# Patient Record
Sex: Male | Born: 1970 | Hispanic: No | Marital: Married | Smoking: Never smoker
Health system: Southern US, Community
[De-identification: ages and names within clinical notes are randomized; demographics above are authoritative.]

## PROBLEM LIST (undated history)

## (undated) DIAGNOSIS — I83813 Varicose veins of bilateral lower extremities with pain: Secondary | ICD-10-CM

## (undated) DIAGNOSIS — I10 Essential (primary) hypertension: Secondary | ICD-10-CM

## (undated) HISTORY — DX: Essential (primary) hypertension: I10

## (undated) HISTORY — DX: Varicose veins of bilateral lower extremities with pain: I83.813

---

## 2011-05-26 ENCOUNTER — Emergency Department: Payer: Self-pay | Admitting: *Deleted

## 2011-05-26 LAB — BASIC METABOLIC PANEL
Anion Gap: 7 (ref 7–16)
BUN: 15 mg/dL (ref 7–18)
Calcium, Total: 8.8 mg/dL (ref 8.5–10.1)
Chloride: 107 mmol/L (ref 98–107)
EGFR (Non-African Amer.): >60
Glucose: 98 mg/dL (ref 65–99)
Osmolality: 282 (ref 275–301)
Potassium: 3.9 mmol/L (ref 3.5–5.1)
Sodium: 141 mmol/L (ref 136–145)

## 2014-05-19 ENCOUNTER — Emergency Department
Admit: 2014-05-19 | Disposition: A | Discharge status: Home / Self Care | Payer: Self-pay | Admitting: Emergency Medicine

## 2014-05-19 LAB — CBC WITH DIFFERENTIAL/PLATELET
BASOS ABS: 0.1 10*3/uL (ref 0.0–0.1)
Basophil %: 0.8 %
EOS ABS: 0.2 10*3/uL (ref 0.0–0.7)
Eosinophil %: 2.8 %
HCT: 42.2 % (ref 40.0–52.0)
HGB: 14.1 g/dL (ref 13.0–18.0)
Lymphocyte #: 1.8 10*3/uL (ref 1.0–3.6)
Lymphocyte %: 22.5 %
MCH: 28.9 pg (ref 26.0–34.0)
MCHC: 33.3 g/dL (ref 32.0–36.0)
MCV: 87 fL (ref 80–100)
Monocyte #: 0.6 x10 3/mm (ref 0.2–1.0)
Monocyte %: 7.6 %
NEUTROS ABS: 5.3 10*3/uL (ref 1.4–6.5)
Neutrophil %: 66.3 %
Platelet: 286 10*3/uL (ref 150–440)
RBC: 4.86 10*6/uL (ref 4.40–5.90)
RDW: 12.7 % (ref 11.5–14.5)
WBC: 8 10*3/uL (ref 3.8–10.6)

## 2014-05-19 LAB — COMPREHENSIVE METABOLIC PANEL
Albumin: 4.3 g/dL
Alkaline Phosphatase: 55 U/L
Anion Gap: 5 — ABNORMAL LOW (ref 7–16)
BUN: 21 mg/dL — ABNORMAL HIGH
Bilirubin,Total: 0.4 mg/dL
CALCIUM: 9.2 mg/dL
Chloride: 106 mmol/L
Co2: 29 mmol/L
Creatinine: 0.84 mg/dL
EGFR (African American): >60
GLUCOSE: 124 mg/dL — AB
Potassium: 4.1 mmol/L
SGOT(AST): 16 U/L
SGPT (ALT): 22 U/L
Sodium: 140 mmol/L
TOTAL PROTEIN: 7.4 g/dL

## 2014-05-19 LAB — LIPASE, BLOOD: LIPASE: 30 U/L

## 2014-06-23 ENCOUNTER — Other Ambulatory Visit: Payer: Self-pay | Admitting: Physician Assistant

## 2014-06-23 DIAGNOSIS — R591 Generalized enlarged lymph nodes: Secondary | ICD-10-CM

## 2014-07-01 ENCOUNTER — Ambulatory Visit
Admission: RE | Admit: 2014-07-01 | Discharge: 2014-07-01 | Disposition: A | Discharge status: Home / Self Care | Payer: No Typology Code available for payment source | Source: Ambulatory Visit | Attending: Physician Assistant | Admitting: Physician Assistant

## 2014-07-01 DIAGNOSIS — R911 Solitary pulmonary nodule: Secondary | ICD-10-CM | POA: Diagnosis not present

## 2014-07-01 DIAGNOSIS — R591 Generalized enlarged lymph nodes: Secondary | ICD-10-CM

## 2015-02-17 ENCOUNTER — Other Ambulatory Visit: Payer: Self-pay | Admitting: Physician Assistant

## 2015-02-17 DIAGNOSIS — R911 Solitary pulmonary nodule: Secondary | ICD-10-CM

## 2015-02-24 ENCOUNTER — Ambulatory Visit: Payer: No Typology Code available for payment source

## 2015-03-01 ENCOUNTER — Ambulatory Visit
Admission: RE | Admit: 2015-03-01 | Discharge: 2015-03-01 | Disposition: A | Discharge status: Home / Self Care | Payer: BLUE CROSS/BLUE SHIELD | Source: Ambulatory Visit | Attending: Physician Assistant | Admitting: Physician Assistant

## 2015-03-01 DIAGNOSIS — K760 Fatty (change of) liver, not elsewhere classified: Secondary | ICD-10-CM | POA: Insufficient documentation

## 2015-03-01 DIAGNOSIS — R918 Other nonspecific abnormal finding of lung field: Secondary | ICD-10-CM | POA: Insufficient documentation

## 2015-03-01 DIAGNOSIS — R911 Solitary pulmonary nodule: Secondary | ICD-10-CM

## 2016-06-05 ENCOUNTER — Other Ambulatory Visit: Payer: Self-pay | Admitting: Physician Assistant

## 2016-06-05 DIAGNOSIS — R911 Solitary pulmonary nodule: Secondary | ICD-10-CM

## 2016-06-18 ENCOUNTER — Ambulatory Visit: Payer: BLUE CROSS/BLUE SHIELD

## 2016-07-03 ENCOUNTER — Ambulatory Visit: Payer: BLUE CROSS/BLUE SHIELD

## 2016-07-09 ENCOUNTER — Ambulatory Visit: Payer: BLUE CROSS/BLUE SHIELD

## 2016-12-17 ENCOUNTER — Other Ambulatory Visit: Payer: Self-pay | Admitting: Student

## 2016-12-17 DIAGNOSIS — K219 Gastro-esophageal reflux disease without esophagitis: Secondary | ICD-10-CM

## 2016-12-17 DIAGNOSIS — R1012 Left upper quadrant pain: Secondary | ICD-10-CM

## 2016-12-25 ENCOUNTER — Ambulatory Visit
Admission: RE | Admit: 2016-12-25 | Discharge: 2016-12-25 | Disposition: A | Discharge status: Home / Self Care | Payer: BLUE CROSS/BLUE SHIELD | Source: Ambulatory Visit | Attending: Student | Admitting: Student

## 2016-12-25 DIAGNOSIS — R1012 Left upper quadrant pain: Secondary | ICD-10-CM | POA: Insufficient documentation

## 2016-12-25 DIAGNOSIS — K219 Gastro-esophageal reflux disease without esophagitis: Secondary | ICD-10-CM | POA: Diagnosis not present

## 2016-12-25 DIAGNOSIS — K449 Diaphragmatic hernia without obstruction or gangrene: Secondary | ICD-10-CM | POA: Diagnosis not present

## 2017-09-23 ENCOUNTER — Ambulatory Visit (INDEPENDENT_AMBULATORY_CARE_PROVIDER_SITE_OTHER): Payer: BLUE CROSS/BLUE SHIELD | Admitting: Vascular Surgery

## 2017-09-23 ENCOUNTER — Encounter (INDEPENDENT_AMBULATORY_CARE_PROVIDER_SITE_OTHER): Payer: Self-pay | Admitting: Vascular Surgery

## 2017-09-23 DIAGNOSIS — I872 Venous insufficiency (chronic) (peripheral): Secondary | ICD-10-CM | POA: Insufficient documentation

## 2017-09-23 DIAGNOSIS — I10 Essential (primary) hypertension: Secondary | ICD-10-CM | POA: Diagnosis not present

## 2017-09-23 DIAGNOSIS — I83813 Varicose veins of bilateral lower extremities with pain: Secondary | ICD-10-CM | POA: Diagnosis not present

## 2017-09-23 DIAGNOSIS — M79606 Pain in leg, unspecified: Secondary | ICD-10-CM | POA: Insufficient documentation

## 2017-09-23 DIAGNOSIS — M79605 Pain in left leg: Secondary | ICD-10-CM | POA: Diagnosis not present

## 2017-09-23 NOTE — Progress Notes (Signed)
MRN : [id number]  Levi Cook is a 47 y.o. (May 27, 1970) male who presents with chief complaint of  Chief Complaint  Patient presents with  . New Patient (Initial Visit)    Varicose Veins    History of Present Illness:   The patient is seen for evaluation of symptomatic varicose veins. The patient relates burning and stinging of the left leg which worsened steadily throughout the course of the day, particularly with standing. The patient also notes an aching and throbbing pain over the varicosities, particularly with prolonged dependent positions. The symptoms are significantly improved with elevation.  The patient also notes that during hot weather the symptoms are greatly intensified. The patient states the pain from the varicose veins interferes with work, daily exercise, shopping and household maintenance. At this point, the symptoms are persistent and severe enough that they're having a negative impact on lifestyle, he works in [employer] and stands for long periods of time and therefore his painful varicose veins are interfering with daily activities.  There is a history of prior surgical intervention of the right leg 20 years ago no sclerotherapy.  There is no history of DVT, PE or superficial thrombophlebitis. There is no history of ulceration or hemorrhage. The patient denies a significant family history of varicose veins.  The patient has worn graduated compression in the past but does not find this helpful. At the present time the patient has not been using over-the-counter analgesics.     No outpatient medications have been marked as taking for the 09/23/17 encounter (Office Visit) with Gilda CreaseSchnier, Latina CraverGregory G, MD.    Past Medical History:  Diagnosis Date  . Hypertension       Social History Social History   Tobacco Use  . Smoking status: Never Smoker  . Smokeless tobacco: Never Used  Substance Use Topics  . Alcohol use: Not Currently  . Drug use: Not on file      Family History No family history on file.  No family history of bleeding/clotting disorders, porphyria or autoimmune disease   No Known Allergies   REVIEW OF SYSTEMS (Negative unless checked)  Constitutional: [] Weight loss  [] Fever  [] Chills Cardiac: [] Chest pain   [] Chest pressure   [] Palpitations   [] Shortness of breath when laying flat   [] Shortness of breath with exertion. Vascular:  [] Pain in legs with walking   [x] Pain in legs at rest  [] History of DVT   [] Phlebitis   [] Swelling in legs   [x] Varicose veins   [] Non-healing ulcers Pulmonary:   [] Uses home oxygen   [] Productive cough   [] Hemoptysis   [] Wheeze  [] COPD   [] Asthma Neurologic:  [] Dizziness   [] Seizures   [] History of stroke   [] History of TIA  [] Aphasia   [] Vissual changes   [] Weakness or numbness in arm   [] Weakness or numbness in leg Musculoskeletal:   [] Joint swelling   [x] Joint pain   [] Low back pain Hematologic:  [] Easy bruising  [] Easy bleeding   [] Hypercoagulable state   [] Anemic Gastrointestinal:  [] Diarrhea   [] Vomiting  [] Gastroesophageal reflux/heartburn   [] Difficulty swallowing. Genitourinary:  [] Chronic kidney disease   [] Difficult urination  [] Frequent urination   [] Blood in urine Skin:  [] Rashes   [] Ulcers  Psychological:  [] History of anxiety   []  History of major depression.  Physical Examination  Vitals:   09/23/17 1130  BP: 125/88  Pulse: 65  Resp: 14  Weight: 256 lb (116.1 kg)  Height: 5\' 7"  (1.702 m)   Body mass  index is 40.1 kg/m. Gen: WD/WN, NAD Head: [place]/AT, No temporalis wasting.  Ear/Nose/Throat: Hearing grossly intact, nares w/o erythema or drainage, poor dentition Eyes: PER, EOMI, sclera nonicteric.  Neck: Supple, no masses.  No bruit or JVD.  Pulmonary:  Good air movement, clear to auscultation bilaterally, no use of accessory muscles.  Cardiac: RRR, normal S1, S2, no Murmurs. Vascular: Large varicosities present extensively greater than 10 mm left.  Moderate to severe venous  stasis changes to the left ankle.  2+ soft pitting edema Vessel Right Left  Radial Palpable Palpable  PT Palpable Palpable  DP Palpable Palpable  Gastrointestinal: soft, non-distended. No guarding/no peritoneal signs.  Musculoskeletal: M/S 5/5 throughout.  No deformity or atrophy.  Neurologic: CN 2-12 intact. Pain and light touch intact in extremities.  Symmetrical.  Speech is fluent. Motor exam as listed above. Psychiatric: Judgment intact, Mood & affect appropriate for pt's clinical situation. Dermatologic: No rashes or ulcers noted.  No changes consistent with cellulitis. Lymph : No Cervical lymphadenopathy, no lichenification or skin changes of chronic lymphedema.  CBC Lab Results  Component Value Date   WBC 8.0 05/19/2014   HGB 14.1 05/19/2014   HCT 42.2 05/19/2014   MCV 87 05/19/2014   PLT 286 05/19/2014    BMET    Component Value Date/Time   NA 140 05/19/2014 1049   K 4.1 05/19/2014 1049   CL 106 05/19/2014 1049   CO2 29 05/19/2014 1049   GLUCOSE 124 (H) 05/19/2014 1049   BUN 21 (H) 05/19/2014 1049   CREATININE 0.84 05/19/2014 1049   CALCIUM 9.2 05/19/2014 1049   GFRNONAA >60 05/19/2014 1049   GFRAA >60 05/19/2014 1049   CrCl cannot be calculated (Patient's most recent lab result is older than the maximum 21 days allowed.).  COAG No results found for: INR, PROTIME  Radiology No results found.  Assessment/Plan 1. Pain of left lower extremity  Recommend:  The patient has large symptomatic varicose veins that are painful and associated with swelling.  I have had a long discussion with the patient regarding  varicose veins and why they cause symptoms.  Patient will begin wearing graduated compression stockings class 1 on a daily basis, beginning first thing in the morning and removing them in the evening. The patient is instructed specifically not to sleep in the stockings.    The patient  will also begin using over-the-counter analgesics such as Motrin 600 mg  po TID to help control the symptoms.    In addition, behavioral modification including elevation during the day will be initiated.    Pending the results of these changes the  patient will be reevaluated at his convenience.   An  ultrasound of the venous system will be obtained.   Further plans will be based on the ultrasound results and whether conservative therapies are successful at eliminating the pain and swelling.   - VAS [place] LOWER EXTREMITY VENOUS REFLUX; Future  2. Varicose veins of both lower extremities with pain Recommend:  The patient has large symptomatic varicose veins that are painful and associated with swelling.  I have had a long discussion with the patient regarding  varicose veins and why they cause symptoms.  Patient will begin wearing graduated compression stockings class 1 on a daily basis, beginning first thing in the morning and removing them in the evening. The patient is instructed specifically not to sleep in the stockings.    The patient  will also begin using over-the-counter analgesics such as Motrin 600  mg po TID to help control the symptoms.    In addition, behavioral modification including elevation during the day will be initiated.    Pending the results of these changes the  patient will be reevaluated at his convenience.   An  ultrasound of the venous system will be obtained.   Further plans will be based on the ultrasound results and whether conservative therapies are successful at eliminating the pain and swelling.    3. Chronic venous insufficiency No surgery or intervention at this point in time.    I have had a long discussion with the patient regarding venous insufficiency and why it  causes symptoms. I have discussed with the patient the chronic skin changes that accompany venous insufficiency and the long term sequela such as infection and ulceration.  Patient will begin wearing graduated compression stockings class 1 (20-30 mmHg) or  compression wraps on a daily basis a prescription was given. The patient will put the stockings on first thing in the morning and removing them in the evening. The patient is instructed specifically not to sleep in the stockings.    In addition, behavioral modification including several periods of elevation of the lower extremities during the day will be continued. I have demonstrated that proper elevation is a position with the ankles at heart level.  The patient is instructed to begin routine exercise, especially walking on a daily basis  Patient should undergo duplex ultrasound of the venous system to ensure that DVT or reflux is not present.  Following the review of the ultrasound the patient will follow up in 2-3 months to reassess the degree of swelling and the control that graduated compression stockings or compression wraps  is offering.   The patient can be assessed for a Lymph Pump at that time  4. Essential hypertension Continue antihypertensive medications as already ordered, these medications have been reviewed and there are no changes at this time.     Levora Dredge, MD  09/23/2017 11:38 AM

## 2017-09-25 NOTE — Progress Notes (Signed)
MRN : [id number]  Levi Cook is a 47 y.o. ([birth year]) male who presents with chief complaint of No chief complaint on file. Marland Kitchen  History of Present Illness:   The patient returns for followup evaluation after his initial visit. The patient continues to have pain in the lower extremities with dependency. The pain is lessened with elevation. Graduated compression stockings, Class I (20-30 mmHg), have been worn but the stockings do not eliminate the leg pain. Over-the-counter analgesics do not improve the symptoms. The degree of discomfort continues to interfere with daily activities. The patient notes the pain in the legs is causing problems with daily exercise, at the workplace and even with household activities and maintenance such as standing in the kitchen preparing meals and doing dishes.   Venous ultrasound shows normal deep venous system, no evidence of acute or chronic DVT.  Superficial reflux is present in the left anterior saphenous vein  No outpatient medications have been marked as taking for the 09/26/17 encounter (Appointment) with Gilda Crease, Latina Craver, MD.    Past Medical History:  Diagnosis Date  . Hypertension     No past surgical history on file.  Social History Social History   Tobacco Use  . Smoking status: Never Smoker  . Smokeless tobacco: Never Used  Substance Use Topics  . Alcohol use: Not Currently  . Drug use: Not on file    Family History No family history on file.  No Known Allergies   REVIEW OF SYSTEMS (Negative unless checked)  Constitutional: [] Weight loss  [] Fever  [] Chills Cardiac: [] Chest pain   [] Chest pressure   [] Palpitations   [] Shortness of breath when laying flat   [] Shortness of breath with exertion. Vascular:  [] Pain in legs with walking   [x] Pain in legs with standing  [] History of DVT   [] Phlebitis   [x] Swelling in legs   [x] Varicose veins   [] Non-healing ulcers Pulmonary:   [] Uses home oxygen   [] Productive cough    [] Hemoptysis   [] Wheeze  [] COPD   [] Asthma Neurologic:  [] Dizziness   [] Seizures   [] History of stroke   [] History of TIA  [] Aphasia   [] Vissual changes   [] Weakness or numbness in arm   [] Weakness or numbness in leg Musculoskeletal:   [] Joint swelling   [] Joint pain   [] Low back pain Hematologic:  [] Easy bruising  [] Easy bleeding   [] Hypercoagulable state   [] Anemic Gastrointestinal:  [] Diarrhea   [] Vomiting  [] Gastroesophageal reflux/heartburn   [] Difficulty swallowing. Genitourinary:  [] Chronic kidney disease   [] Difficult urination  [] Frequent urination   [] Blood in urine Skin:  [] Rashes   [] Ulcers  Psychological:  [] History of anxiety   []  History of major depression.  Physical Examination  There were no vitals filed for this visit. There is no height or weight on file to calculate BMI. Gen: WD/WN, NAD Head: [place]/AT, No temporalis wasting.  Ear/Nose/Throat: Hearing grossly intact, nares w/o erythema or drainage Eyes: PER, EOMI, sclera nonicteric.  Neck: Supple, no large masses.   Pulmonary:  Good air movement, no audible wheezing bilaterally, no use of accessory muscles.  Cardiac: RRR, no JVD Vascular:Large varicosities present extensively greater than 10 mm left leg.  Mild venous stasis changes to the legs bilaterally.  2+ soft pitting edema Vessel Right Left  Radial Palpable Palpable  PT Palpable Palpable  DP Palpable Palpable  Gastrointestinal: Non-distended. No guarding/no peritoneal signs.  Musculoskeletal: M/S 5/5 throughout.  No deformity or atrophy.  Neurologic: CN 2-12 intact. Symmetrical.  Speech is  fluent. Motor exam as listed above. Psychiatric: Judgment intact, Mood & affect appropriate for pt's clinical situation. Dermatologic: left ankle venous rashes no ulcers noted.  No changes consistent with cellulitis. Lymph : No lichenification or skin changes of chronic lymphedema.  CBC Lab Results  Component Value Date   WBC 8.0 05/19/2014   HGB 14.1 05/19/2014   HCT  42.2 05/19/2014   MCV 87 05/19/2014   PLT 286 05/19/2014    BMET    Component Value Date/Time   NA 140 05/19/2014 1049   K 4.1 05/19/2014 1049   CL 106 05/19/2014 1049   CO2 29 05/19/2014 1049   GLUCOSE 124 (H) 05/19/2014 1049   BUN 21 (H) 05/19/2014 1049   CREATININE 0.84 05/19/2014 1049   CALCIUM 9.2 05/19/2014 1049   GFRNONAA >60 05/19/2014 1049   GFRAA >60 05/19/2014 1049   CrCl cannot be calculated (Patient's most recent lab result is older than the maximum 21 days allowed.).  COAG No results found for: INR, PROTIME  Radiology No results found.  Assessment/Plan 1. Varicose veins of both lower extremities with pain Recommend  I have reviewed my previous  discussion with the patient regarding  varicose veins and why they cause symptoms. Patient will continue  wearing graduated compression stockings class 1 on a daily basis, beginning first thing in the morning and removing them in the evening.    In addition, behavioral modification including elevation during the day was again discussed and this will continue.  The patient has utilized over the counter pain medications and has been exercising.  However, at this time conservative therapy has not alleviated the patient's symptoms of leg pain and swelling  Recommend: laser ablation of the left anterior saphenous vein to eliminate the symptoms of pain and swelling of the lower extremities caused by the severe superficial venous reflux disease.   2. Chronic venous insufficiency No surgery or intervention at this point in time.    I have had a long discussion with the patient regarding venous insufficiency and why it  causes symptoms. I have discussed with the patient the chronic skin changes that accompany venous insufficiency and the long term sequela such as infection and ulceration.  Patient will begin wearing graduated compression stockings class 1 (20-30 mmHg) or compression wraps on a daily basis a prescription was  given. The patient will put the stockings on first thing in the morning and removing them in the evening. The patient is instructed specifically not to sleep in the stockings.    In addition, behavioral modification including several periods of elevation of the lower extremities during the day will be continued. I have demonstrated that proper elevation is a position with the ankles at heart level.  The patient is instructed to begin routine exercise, especially walking on a daily basis  3. Pain of lower extremity, unspecified laterality See #1  4. Essential hypertension Continue antihypertensive medications as already ordered, these medications have been reviewed and there are no changes at this time.     Levora DredgeGregory Reygan Heagle, MD  09/25/2017 7:54 AM

## 2017-09-26 ENCOUNTER — Ambulatory Visit (INDEPENDENT_AMBULATORY_CARE_PROVIDER_SITE_OTHER): Payer: BLUE CROSS/BLUE SHIELD | Admitting: Vascular Surgery

## 2017-09-26 ENCOUNTER — Ambulatory Visit (INDEPENDENT_AMBULATORY_CARE_PROVIDER_SITE_OTHER): Payer: BLUE CROSS/BLUE SHIELD

## 2017-09-26 ENCOUNTER — Encounter (INDEPENDENT_AMBULATORY_CARE_PROVIDER_SITE_OTHER): Payer: Self-pay | Admitting: Vascular Surgery

## 2017-09-26 VITALS — BP 140/94 | HR 64 | Resp 14 | Ht 70.0 in | Wt 256.0 lb

## 2017-09-26 DIAGNOSIS — I83813 Varicose veins of bilateral lower extremities with pain: Secondary | ICD-10-CM | POA: Diagnosis not present

## 2017-09-26 DIAGNOSIS — I872 Venous insufficiency (chronic) (peripheral): Secondary | ICD-10-CM

## 2017-09-26 DIAGNOSIS — M79605 Pain in left leg: Secondary | ICD-10-CM | POA: Diagnosis not present

## 2017-09-26 DIAGNOSIS — I10 Essential (primary) hypertension: Secondary | ICD-10-CM

## 2017-09-26 DIAGNOSIS — M79606 Pain in leg, unspecified: Secondary | ICD-10-CM | POA: Diagnosis not present

## 2017-10-01 ENCOUNTER — Encounter (INDEPENDENT_AMBULATORY_CARE_PROVIDER_SITE_OTHER): Payer: Self-pay | Admitting: Vascular Surgery

## 2017-10-11 ENCOUNTER — Telehealth (INDEPENDENT_AMBULATORY_CARE_PROVIDER_SITE_OTHER): Payer: Self-pay | Admitting: Vascular Surgery

## 2017-11-21 ENCOUNTER — Encounter (INDEPENDENT_AMBULATORY_CARE_PROVIDER_SITE_OTHER): Payer: Self-pay | Admitting: Vascular Surgery

## 2017-11-21 ENCOUNTER — Ambulatory Visit (INDEPENDENT_AMBULATORY_CARE_PROVIDER_SITE_OTHER): Payer: BLUE CROSS/BLUE SHIELD | Admitting: Vascular Surgery

## 2017-11-21 VITALS — BP 121/90 | HR 69 | Resp 18 | Ht 69.0 in | Wt 255.0 lb

## 2017-11-21 DIAGNOSIS — I83813 Varicose veins of bilateral lower extremities with pain: Secondary | ICD-10-CM

## 2017-11-23 ENCOUNTER — Encounter (INDEPENDENT_AMBULATORY_CARE_PROVIDER_SITE_OTHER): Payer: Self-pay | Admitting: Vascular Surgery

## 2017-11-23 HISTORY — PX: ENDOVENOUS ABLATION SAPHENOUS VEIN W/ LASER: SUR449

## 2017-11-23 NOTE — Progress Notes (Signed)
    MRN : [id number]  Levi Cook is a 47 y.o. (02-02-1970) male who presents with chief complaint of  Chief Complaint  Patient presents with  . Follow-up    L ASV Laser ablation  .    The patient's left lower extremity was sterilely prepped and draped.  The ultrasound machine was used to visualize the left anterior saphenous vein throughout its course.  A segment in the mid thigh was selected for access.  The saphenous vein was accessed without difficulty using ultrasound guidance with a micropuncture needle.   An 0.018  wire was placed beyond the saphenofemoral junction through the sheath and the microneedle was removed.  The 65 cm sheath was then placed over the wire and the wire and dilator were removed.  The laser fiber was placed through the sheath and its tip was placed approximately 2 cm below the saphenofemoral junction.  Tumescent anesthesia was then created with a dilute lidocaine solution.  Laser energy was then delivered with constant withdrawal of the sheath and laser fiber.  Approximately 1090 Joules of energy were delivered over a length of 18 cm.  Sterile dressings were placed.  The patient tolerated the procedure well without complications.

## 2017-11-25 ENCOUNTER — Ambulatory Visit (INDEPENDENT_AMBULATORY_CARE_PROVIDER_SITE_OTHER): Payer: BLUE CROSS/BLUE SHIELD

## 2017-11-25 ENCOUNTER — Other Ambulatory Visit (INDEPENDENT_AMBULATORY_CARE_PROVIDER_SITE_OTHER): Payer: Self-pay | Admitting: Vascular Surgery

## 2017-11-25 ENCOUNTER — Encounter (INDEPENDENT_AMBULATORY_CARE_PROVIDER_SITE_OTHER): Payer: Self-pay

## 2017-11-25 ENCOUNTER — Ambulatory Visit (INDEPENDENT_AMBULATORY_CARE_PROVIDER_SITE_OTHER): Payer: BLUE CROSS/BLUE SHIELD | Admitting: Vascular Surgery

## 2017-11-25 ENCOUNTER — Encounter (INDEPENDENT_AMBULATORY_CARE_PROVIDER_SITE_OTHER): Payer: Self-pay | Admitting: Vascular Surgery

## 2017-11-25 VITALS — BP 125/86 | HR 86 | Resp 17 | Ht 69.0 in | Wt 255.0 lb

## 2017-11-25 DIAGNOSIS — I872 Venous insufficiency (chronic) (peripheral): Secondary | ICD-10-CM

## 2017-11-25 DIAGNOSIS — I83813 Varicose veins of bilateral lower extremities with pain: Secondary | ICD-10-CM

## 2017-11-25 DIAGNOSIS — I8002 Phlebitis and thrombophlebitis of superficial vessels of left lower extremity: Secondary | ICD-10-CM

## 2017-11-25 DIAGNOSIS — I10 Essential (primary) hypertension: Secondary | ICD-10-CM

## 2017-11-25 MED ORDER — HYDROCODONE-ACETAMINOPHEN 5-325 MG PO TABS: 1.0000 | ORAL_TABLET | Freq: Four times a day (QID) | ORAL | 0 refills | Status: AC | PRN

## 2017-12-06 ENCOUNTER — Encounter (INDEPENDENT_AMBULATORY_CARE_PROVIDER_SITE_OTHER): Payer: Self-pay | Admitting: Vascular Surgery

## 2017-12-06 DIAGNOSIS — I809 Phlebitis and thrombophlebitis of unspecified site: Secondary | ICD-10-CM | POA: Insufficient documentation

## 2017-12-06 NOTE — Progress Notes (Signed)
MRN : [id number]  Levi Cook is a 47 y.o. (21-Apr-1970) male who presents with chief complaint of  Chief Complaint  Patient presents with  . Follow-up    Left Leg Laser follow up  .  History of Present Illness:   The patient returns to the office for followup status post laser ablation of the left anterior saphenous vein on 11/21/2017.  The patient note significant improvement in the lower extremity pain but not resolution of the symptoms. The patient notes multiple residual varicosities bilaterally which continued to hurt with dependent positions and remained tender to palpation. The patient's swelling is minimally from preoperative status. The patient continues to wear graduated compression stockings on a daily basis but these are not eliminating the pain and discomfort. The patient continues to use over-the-counter anti-inflammatory medications to treat the pain and related symptoms but this has not given the patient relief. The patient notes the pain in the lower extremities is causing problems with daily exercise, problems at work and even with household activities such as preparing meals and doing dishes.  The patient is otherwise done well and there have been no complications related to the laser procedure or interval changes in the patient's overall   Post laser ultrasound shows successful ablation of the left anterior saphenous vein associated with extensive STP    No outpatient medications have been marked as taking for the 11/25/17 encounter (Office Visit) with Gilda Crease, Latina Craver, MD.    Past Medical History:  Diagnosis Date  . Hypertension     Past Surgical History:  Procedure Laterality Date  . ENDOVENOUS ABLATION SAPHENOUS VEIN W/ LASER Left 11/23/2017    Social History Social History   Tobacco Use  . Smoking status: Never Smoker  . Smokeless tobacco: Never Used  Substance Use Topics  . Alcohol use: Not Currently  . Drug use: Not on file    Family  History No family history on file.  No Known Allergies   REVIEW OF SYSTEMS (Negative unless checked)  Constitutional: [] Weight loss  [] Fever  [] Chills Cardiac: [] Chest pain   [] Chest pressure   [] Palpitations   [] Shortness of breath when laying flat   [] Shortness of breath with exertion. Vascular:  [x] Pain in legs with walking   [x] Pain in legs at rest  [] History of DVT   [x] Phlebitis   [x] Swelling in legs   [x] Varicose veins   [] Non-healing ulcers Pulmonary:   [] Uses home oxygen   [] Productive cough   [] Hemoptysis   [] Wheeze  [] COPD   [] Asthma Neurologic:  [] Dizziness   [] Seizures   [] History of stroke   [] History of TIA  [] Aphasia   [] Vissual changes   [] Weakness or numbness in arm   [] Weakness or numbness in leg Musculoskeletal:   [] Joint swelling   [] Joint pain   [] Low back pain Hematologic:  [] Easy bruising  [] Easy bleeding   [] Hypercoagulable state   [] Anemic Gastrointestinal:  [] Diarrhea   [] Vomiting  [] Gastroesophageal reflux/heartburn   [] Difficulty swallowing. Genitourinary:  [] Chronic kidney disease   [] Difficult urination  [] Frequent urination   [] Blood in urine Skin:  [] Rashes   [] Ulcers  Psychological:  [] History of anxiety   []  History of major depression.  Physical Examination  Vitals:   11/25/17 1156  BP: 125/86  Pulse: 86  Resp: 17  Weight: 255 lb (115.7 kg)  Height: 5\' 9"  (1.753 m)   Body mass index is 37.66 kg/m. Gen: WD/WN, NAD Head: [place]/AT, No temporalis wasting.  Ear/Nose/Throat: Hearing grossly intact, nares w/o  erythema or drainage Eyes: PER, EOMI, sclera nonicteric.  Neck: Supple, no large masses.   Pulmonary:  Good air movement, no audible wheezing bilaterally, no use of accessory muscles.  Cardiac: RRR, no JVD Vascular: left left with palpable STP tender and red with increase warmth Vessel Right Left  Radial Palpable Palpable  PT Palpable Palpable  DP Palpable Palpable  Gastrointestinal: Non-distended. No guarding/no peritoneal signs.    Musculoskeletal: M/S 5/5 throughout.  No deformity or atrophy.  Neurologic: CN 2-12 intact. Symmetrical.  Speech is fluent. Motor exam as listed above. Psychiatric: Judgment intact, Mood & affect appropriate for pt's clinical situation. Dermatologic: venous rashes no ulcers noted.  No changes consistent with cellulitis. Lymph : No lichenification or skin changes of chronic lymphedema.  CBC Lab Results  Component Value Date   WBC 8.0 05/19/2014   HGB 14.1 05/19/2014   HCT 42.2 05/19/2014   MCV 87 05/19/2014   PLT 286 05/19/2014    BMET    Component Value Date/Time   NA 140 05/19/2014 1049   K 4.1 05/19/2014 1049   CL 106 05/19/2014 1049   CO2 29 05/19/2014 1049   GLUCOSE 124 (H) 05/19/2014 1049   BUN 21 (H) 05/19/2014 1049   CREATININE 0.84 05/19/2014 1049   CALCIUM 9.2 05/19/2014 1049   GFRNONAA >60 05/19/2014 1049   GFRAA >60 05/19/2014 1049   CrCl cannot be calculated (Patient's most recent lab result is older than the maximum 21 days allowed.).  COAG No results found for: INR, PROTIME  Radiology Vas [place] Lower Extremity Venous Post Ablation  Result Date: 11/25/2017  Lower Venous Study Other Indications: Left anterior saphanoue vein ablation on 11/21/17. Performing Technologist: Jamse Mead RT, RDMS, RVT  Examination Guidelines: A complete evaluation includes B-mode imaging, spectral Doppler, color Doppler, and power Doppler as needed of all accessible portions of each vessel. Bilateral testing is considered an integral part of a complete examination. Limited examinations for reoccurring indications may be performed as noted.  Left Venous Findings: +--------------------+---------------+---------+-----------+----------+-------+                     CompressibilityPhasicitySpontaneityPropertiesSummary +--------------------+---------------+---------+-----------+----------+-------+ CFV                 Full           Yes      Yes                           +--------------------+---------------+---------+-----------+----------+-------+ SFJ                 Full                                                 +--------------------+---------------+---------+-----------+----------+-------+ FV                  Full           Yes      Yes                          +--------------------+---------------+---------+-----------+----------+-------+ POP                 Full           Yes      Yes                          +--------------------+---------------+---------+-----------+----------+-------+  GSV                 Full                                                 +--------------------+---------------+---------+-----------+----------+-------+ SSV                 Full                                                 +--------------------+---------------+---------+-----------+----------+-------+ Anterior Saphenous VNone                                                 +--------------------+---------------+---------+-----------+----------+-------+  Left Technical Findings: The dilated anterior saphenous vein appears non compressible from the level of the mid thigh to approximately 1.5 cm from the SFJ.  Summary: Left: Successful vein closure.There is no evidence of deep vein thrombosis in the lower extremity.  *See table(s) above for measurements and observations. Electronically signed by Levora Dredge MD on 11/25/2017 at 4:36:45 PM.    Final      Assessment/Plan 1. Thrombophlebitis of superficial veins of left lower extremity Recommend:  Treat the STP with compresses and NSAIDs given the extensive nature of the phlebitis I will give him an Rx of Percocet.  The patient has had successful ablation of the previously incompetent saphenous venous system but still has persistent symptoms of pain and swelling that are having a negative impact on daily life and daily activities.  Patient should undergo injection sclerotherapy to  treat the residual varicosities.  The risks, benefits and alternative therapies were reviewed in detail with the patient.  All questions were answered.  The patient agrees to proceed with sclerotherapy at their convenience.  The patient will continue wearing the graduated compression stockings and using the over-the-counter pain medications to treat her symptoms.     2. Varicose veins of both lower extremities with pain Recommend:  The patient has had successful ablation of the previously incompetent saphenous venous system but still has persistent symptoms of pain and swelling that are having a negative impact on daily life and daily activities.  Patient should undergo injection sclerotherapy to treat the residual varicosities.  The risks, benefits and alternative therapies were reviewed in detail with the patient.  All questions were answered.  The patient agrees to proceed with sclerotherapy at their convenience.  The patient will continue wearing the graduated compression stockings and using the over-the-counter pain medications to treat her symptoms.    3. Chronic venous insufficiency No surgery or intervention at this point in time.    I have had a long discussion with the patient regarding venous insufficiency and why it  causes symptoms. I have discussed with the patient the chronic skin changes that accompany venous insufficiency and the long term sequela such as infection and ulceration.  Patient will begin wearing graduated compression stockings class 1 (20-30 mmHg) or compression wraps on a daily basis a prescription was given. The patient will put the stockings on first thing in the morning and removing them in the evening.  The patient is instructed specifically not to sleep in the stockings.    In addition, behavioral modification including several periods of elevation of the lower extremities during the day will be continued. I have demonstrated that proper elevation is a  position with the ankles at heart level.  The patient is instructed to begin routine exercise, especially walking on a daily basis   4. Essential hypertension Continue antihypertensive medications as already ordered, these medications have been reviewed and there are no changes at this time.     Levora Dredge, MD  12/06/2017 1:47 PM

## 2017-12-17 ENCOUNTER — Ambulatory Visit (INDEPENDENT_AMBULATORY_CARE_PROVIDER_SITE_OTHER): Payer: BLUE CROSS/BLUE SHIELD | Admitting: Vascular Surgery

## 2017-12-17 ENCOUNTER — Encounter (INDEPENDENT_AMBULATORY_CARE_PROVIDER_SITE_OTHER): Payer: Self-pay | Admitting: Vascular Surgery

## 2017-12-17 VITALS — BP 130/87 | HR 67 | Resp 18 | Ht 70.5 in | Wt 249.0 lb

## 2017-12-17 DIAGNOSIS — I83813 Varicose veins of bilateral lower extremities with pain: Secondary | ICD-10-CM

## 2017-12-17 DIAGNOSIS — I83812 Varicose veins of left lower extremities with pain: Secondary | ICD-10-CM | POA: Diagnosis not present

## 2017-12-17 NOTE — Progress Notes (Signed)
Varicose veins of left lower extremity with inflammation (454.1  I83.10) Current Plans   Indication: Patient presents with symptomatic varicose veins of the left lower extremity.   Procedure: Sclerotherapy using hypertonic saline mixed with 1% Lidocaine was performed on the left lower extremity. Compression wraps were placed. The patient tolerated the procedure well. 

## 2018-01-08 ENCOUNTER — Ambulatory Visit (INDEPENDENT_AMBULATORY_CARE_PROVIDER_SITE_OTHER): Payer: BLUE CROSS/BLUE SHIELD | Admitting: Vascular Surgery

## 2018-01-08 ENCOUNTER — Encounter (INDEPENDENT_AMBULATORY_CARE_PROVIDER_SITE_OTHER): Payer: Self-pay | Admitting: Vascular Surgery

## 2018-01-08 VITALS — BP 119/79 | HR 70 | Resp 14 | Ht 66.0 in | Wt 249.4 lb

## 2018-01-08 DIAGNOSIS — I83813 Varicose veins of bilateral lower extremities with pain: Secondary | ICD-10-CM | POA: Diagnosis not present

## 2018-01-08 NOTE — Progress Notes (Signed)
Varicose veins of left lower extremity with inflammation (454.1  I83.10) Current Plans   Indication: Patient presents with symptomatic varicose veins of the left lower extremity.   Procedure: Sclerotherapy using hypertonic saline mixed with 1% Lidocaine was performed on the left lower extremity. Compression wraps were placed. The patient tolerated the procedure well.  Please note: at this time, the patient is not interested in undergoing any additional sclerotherapy.  Patient should call office if he would like to pursue more in the the future

## 2018-01-14 ENCOUNTER — Ambulatory Visit (INDEPENDENT_AMBULATORY_CARE_PROVIDER_SITE_OTHER): Payer: BLUE CROSS/BLUE SHIELD | Admitting: Vascular Surgery

## 2018-04-21 ENCOUNTER — Emergency Department
Admission: EM | Admit: 2018-04-21 | Discharge: 2018-04-21 | Disposition: A | Discharge status: Home / Self Care | Payer: BLUE CROSS/BLUE SHIELD | Attending: Emergency Medicine | Admitting: Emergency Medicine

## 2018-04-21 ENCOUNTER — Emergency Department: Payer: BLUE CROSS/BLUE SHIELD

## 2018-04-21 ENCOUNTER — Encounter: Payer: Self-pay | Admitting: Emergency Medicine

## 2018-04-21 ENCOUNTER — Other Ambulatory Visit: Payer: Self-pay

## 2018-04-21 DIAGNOSIS — Z79899 Other long term (current) drug therapy: Secondary | ICD-10-CM | POA: Diagnosis not present

## 2018-04-21 DIAGNOSIS — I10 Essential (primary) hypertension: Secondary | ICD-10-CM | POA: Insufficient documentation

## 2018-04-21 DIAGNOSIS — R1012 Left upper quadrant pain: Secondary | ICD-10-CM | POA: Insufficient documentation

## 2018-04-21 DIAGNOSIS — R112 Nausea with vomiting, unspecified: Secondary | ICD-10-CM | POA: Insufficient documentation

## 2018-04-21 LAB — CBC WITH DIFFERENTIAL/PLATELET
Abs Immature Granulocytes: 0.04 10*3/uL (ref 0.00–0.07)
BASOS PCT: 0 %
Basophils Absolute: 0.1 10*3/uL (ref 0.0–0.1)
Eosinophils Absolute: 0 10*3/uL (ref 0.0–0.5)
Eosinophils Relative: 0 %
HCT: 44 % (ref 39.0–52.0)
Hemoglobin: 14.7 g/dL (ref 13.0–17.0)
Immature Granulocytes: 0 %
Lymphocytes Relative: 14 %
Lymphs Abs: 1.7 10*3/uL (ref 0.7–4.0)
MCH: 29 pg (ref 26.0–34.0)
MCHC: 33.4 g/dL (ref 30.0–36.0)
MCV: 86.8 fL (ref 80.0–100.0)
Monocytes Absolute: 0.6 10*3/uL (ref 0.1–1.0)
Monocytes Relative: 5 %
NRBC: 0 % (ref 0.0–0.2)
Neutro Abs: 9.5 10*3/uL — ABNORMAL HIGH (ref 1.7–7.7)
Neutrophils Relative %: 81 %
PLATELETS: 335 10*3/uL (ref 150–400)
RBC: 5.07 MIL/uL (ref 4.22–5.81)
RDW: 12.4 % (ref 11.5–15.5)
WBC: 11.8 10*3/uL — ABNORMAL HIGH (ref 4.0–10.5)

## 2018-04-21 LAB — COMPREHENSIVE METABOLIC PANEL
ALBUMIN: 4.3 g/dL (ref 3.5–5.0)
ALK PHOS: 59 U/L (ref 38–126)
ALT: 36 U/L (ref 0–44)
AST: 19 U/L (ref 15–41)
Anion gap: 9 (ref 5–15)
BUN: 21 mg/dL — ABNORMAL HIGH (ref 6–20)
CALCIUM: 9.1 mg/dL (ref 8.9–10.3)
CO2: 26 mmol/L (ref 22–32)
CREATININE: 0.94 mg/dL (ref 0.61–1.24)
Chloride: 105 mmol/L (ref 98–111)
GFR calc Af Amer: >60 mL/min (ref >60)
GFR calc non Af Amer: >60 mL/min (ref >60)
GLUCOSE: 153 mg/dL — AB (ref 70–99)
Potassium: 3.6 mmol/L (ref 3.5–5.1)
Sodium: 140 mmol/L (ref 135–145)
Total Bilirubin: 0.4 mg/dL (ref 0.3–1.2)
Total Protein: 7.7 g/dL (ref 6.5–8.1)

## 2018-04-21 LAB — LIPASE, BLOOD: Lipase: 27 U/L (ref 11–51)

## 2018-04-21 LAB — TROPONIN I: Troponin I: <0.03 ng/mL (ref <0.03)

## 2018-04-21 MED ORDER — IOHEXOL 300 MG/ML  SOLN
125.0000 mL | Freq: Once | INTRAMUSCULAR | Status: AC | PRN
  Administered 2018-04-21: 125 mL via INTRAVENOUS

## 2018-04-21 MED ORDER — SODIUM CHLORIDE 0.9% FLUSH: 3.0000 mL | Freq: Once | INTRAVENOUS | Status: DC

## 2018-04-21 MED ORDER — HYDROMORPHONE HCL 1 MG/ML IJ SOLN
0.5000 mg | Freq: Once | INTRAMUSCULAR | Status: AC
  Administered 2018-04-21: 0.5 mg via INTRAVENOUS
  Filled 2018-04-21: qty 1

## 2018-04-21 MED ORDER — IOPAMIDOL (ISOVUE-300) INJECTION 61%
30.0000 mL | Freq: Once | INTRAVENOUS | Status: AC | PRN
  Administered 2018-04-21: 30 mL via ORAL

## 2018-04-21 MED ORDER — ONDANSETRON 4 MG PO TBDP
4.0000 mg | ORAL_TABLET | Freq: Once | ORAL | Status: AC | PRN
  Administered 2018-04-21: 4 mg via ORAL
  Filled 2018-04-21: qty 1

## 2018-04-21 MED ORDER — MORPHINE SULFATE (PF) 4 MG/ML IV SOLN
4.0000 mg | Freq: Once | INTRAVENOUS | Status: AC
Start: 2018-04-21 — End: 2018-04-21
  Administered 2018-04-21: 4 mg via INTRAVENOUS
  Filled 2018-04-21: qty 1

## 2018-04-21 MED ORDER — ONDANSETRON HCL 4 MG/2ML IJ SOLN
4.0000 mg | Freq: Once | INTRAMUSCULAR | Status: AC
  Administered 2018-04-21: 4 mg via INTRAVENOUS
  Filled 2018-04-21: qty 2

## 2018-04-21 NOTE — ED Notes (Signed)
Pt left in [place].

## 2018-04-21 NOTE — ED Triage Notes (Signed)
Pt arrives ambulatory to triage with c/o abdominal pain which radiates around to his back. Pt states that he does reflux and has taken his medication for reflux without relief. Pt also states that he has vomited multiple times. Pt is moving in pain at this time.

## 2018-04-21 NOTE — Discharge Instructions (Signed)
Please follow-up with your regular doctor.  Please continue all your medications.  Please return for worse pain, fever, vomiting or feeling sicker.

## 2018-04-21 NOTE — ED Provider Notes (Signed)
[facility] Emergency Department Provider Note   ____________________________________________   First MD Initiated Contact with Patient 04/21/18 [phone]     (approximate)  I have reviewed the triage vital signs and the nursing notes.   HISTORY  Chief Complaint Abdominal Pain    HPI Levi Cook is a 48 y.o. male patient reports he gets reflux pain in the left side of his upper abdomen usually he can take his reflux pills and it goes away but today it did not go away and he has been having a lot of vomiting.  He is last vomited prior to coming to the emergency room.  He got some nausea medicine here and feels a little bit better but he still nauseated.  He does not think he can drink any GI cocktail right now.  Pain seems to be moving around a little bit on the left side it just hurts.  Moderate in severity.  Is been constant as I understand it since it came on.         Past Medical History:  Diagnosis Date  . Hypertension   . Varicose veins of bilateral lower extremities with pain     Patient Active Problem List   Diagnosis Date Noted  . Superficial thrombophlebitis 12/06/2017  . Leg pain 09/23/2017  . 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 Chronic venous insufficiency 09/23/2017  . Essential hypertension 09/23/2017    Past Surgical History:  Procedure Laterality Date  . ENDOVENOUS ABLATION SAPHENOUS VEIN W/ LASER Left 11/23/2017    Prior to Admission medications   Medication Sig Start Date End Date Taking? Authorizing Provider  ALPRAZolam (XANAX) 0.5 MG tablet TAKE 1 TAB ONE HOUR PRIOR TO PROCEDURE. TAKE SECOND TAB UPON ARRIVAL 11/14/17   [provider]  HYDROcodone-acetaminophen (NORCO) 5-325 MG tablet Take 1-2 tablets by mouth every 6 (six) hours as needed for moderate pain or severe pain. 11/25/17   Schnier, Latina Craver, MD  ibuprofen (ADVIL,MOTRIN) 200 MG tablet Take 200 mg by mouth every 6 (six) hours  as needed.    [provider]  lisinopril-hydrochlorothiazide (PRINZIDE,ZESTORETIC) 10-12.5 MG tablet Take 1 tablet by mouth daily. for high blood pressure 08/19/17   [provider]    Allergies Patient has no known allergies.  History reviewed. No pertinent family history.  Social History Social History   Tobacco Use  . Smoking status: Never Smoker  . Smokeless tobacco: Never Used  Substance Use Topics  . Alcohol use: Not Currently  . Drug use: Never    Review of Systems  Constitutional: No fever/chills Eyes: No visual changes. ENT: No sore throat. Cardiovascular: Denies chest pain. Respiratory: Denies shortness of breath. Gastrointestinal: See HPI, no diarrhea or constipation Genitourinary: Negative for dysuria. Musculoskeletal: Negative for back pain. Skin: Negative for rash. Neurological: Negative for headaches, focal weakness   ____________________________________________   PHYSICAL EXAM:  VITAL SIGNS: ED Triage Vitals  Enc Vitals Group     BP 04/21/18 0338 (!) 174/112     Pulse Rate 04/21/18 0338 69     Resp 04/21/18 0338 18     Temp 04/21/18 0338 98.3 F (36.8 C)     Temp Source 04/21/18 0338 Oral     SpO2 04/21/18 0338 97 %     Weight 04/21/18 0318 245 lb (111.1 kg)     Height 04/21/18 0318 5\' 7"  (1.702 m)     Head Circumference --      Peak Flow --  Pain Score 04/21/18 0318 10     Pain Loc --      Pain Edu? --      Excl. in GC? --     Constitutional: Alert and oriented. Well appearing and in no acute distress. Eyes: Conjunctivae are normal.  Head: Atraumatic. Nose: No congestion/rhinnorhea. Mouth/Throat: Mucous membranes are moist.  Oropharynx non-erythematous. Neck: No stridor.   Cardiovascular: Normal rate, regular rhythm. Grossly normal heart sounds.  Good peripheral circulation. Respiratory: Normal respiratory effort.  No retractions. Lungs CTAB. Gastrointestinal: Soft and nontender. No distention. No abdominal  bruits. No CVA tenderness. Musculoskeletal: No lower extremity tenderness nor edema.  Neurologic:  Normal speech and language. No gross focal neurologic deficits are appreciated. No gait instability. Skin:  Skin is warm, dry and intact. No rash noted.   ____________________________________________   LABS (all labs ordered are listed, but only abnormal results are displayed)  Labs Reviewed  LIPASE, BLOOD  COMPREHENSIVE METABOLIC PANEL  URINALYSIS, COMPLETE (UACMP) WITH MICROSCOPIC  TROPONIN I  CBC WITH DIFFERENTIAL/PLATELET   ____________________________________________  EKG   ____________________________________________  RADIOLOGY  ED MD interpretation:   Official radiology report(s): No results found.  ____________________________________________   PROCEDURES  Procedure(s) performed (including Critical Care):  Procedures   ____________________________________________   INITIAL IMPRESSION / ASSESSMENT AND PLAN / ED COURSE               ____________________________________________   FINAL CLINICAL IMPRESSION(S) / ED DIAGNOSES  Final diagnoses:  None     ED Discharge Orders    None       Note:  This document was prepared using Dragon voice recognition software and may include unintentional dictation errors.    Arnaldo Natal, MD 04/21/18 [phone]

## 2018-04-21 NOTE — ED Notes (Signed)
Prior to giving pain medication patient questioned about getting a ride home, patient stated he planned to drive home.  Explained to patient that he could not drive because he had been given a narcotic and could not drive for the next 4 to 6 hours.  Patient stated he could call his wife.  Instructed patient to tell wife she would need to come into the ED waiting room and let the nurse know she was here to pick him up.  Patient verbalized understanding.

## 2018-04-21 NOTE — ED Notes (Signed)
Pt awaiting for a ride to come pick him up. States wife does not have car to come get him. Calling friends or an [facility] and will let this RN know. This RN will make sure patient does not drive when he leaves facility. Pt verbalizes undestanding.

## 2018-12-23 ENCOUNTER — Telehealth (INDEPENDENT_AMBULATORY_CARE_PROVIDER_SITE_OTHER): Payer: Self-pay | Admitting: Vascular Surgery

## 2018-12-23 NOTE — Telephone Encounter (Signed)
We typically don't treat bites. Is he concerned about the spider veins or he concerned that he may have been bitten? If he is concerned that he has been bitten he needs to contact his PCP for treatment as we wouldn't be able to offer options.  If he is concerned about the spider veins he can come in with bilateral reflux studies.  Due to schedule constraints, this should be safe to do after thanksgiving

## 2018-12-24 NOTE — Telephone Encounter (Signed)
Patient has appointment schedule. 

## 2019-01-14 ENCOUNTER — Ambulatory Visit (INDEPENDENT_AMBULATORY_CARE_PROVIDER_SITE_OTHER): Payer: BLUE CROSS/BLUE SHIELD | Admitting: Nurse Practitioner

## 2019-01-14 ENCOUNTER — Encounter (INDEPENDENT_AMBULATORY_CARE_PROVIDER_SITE_OTHER): Payer: BLUE CROSS/BLUE SHIELD

## 2019-10-06 IMAGING — CT CT ABDOMEN AND PELVIS WITH CONTRAST
2 of 5 series · 16 of 46 positions shown, 18 images · IV contrast (APPLIED)
Comparison: 05/19/2014

CLINICAL DATA: Left upper quadrant abdominal pain

EXAM:
CT ABDOMEN AND PELVIS WITH CONTRAST
TECHNIQUE: Multidetector CT imaging of the abdomen and pelvis was performed
using the standard protocol following bolus administration of
intravenous contrast.
CONTRAST:  125mL OMNIPAQUE IOHEXOL 300 MG/ML  SOLN

[Series 2: routine abd/pel with · axial · 0.93mm/px · z∈[-1125,-605]mm · 13 of 116 slices shown, 15 images]
[im 6/116  soft-tissue]
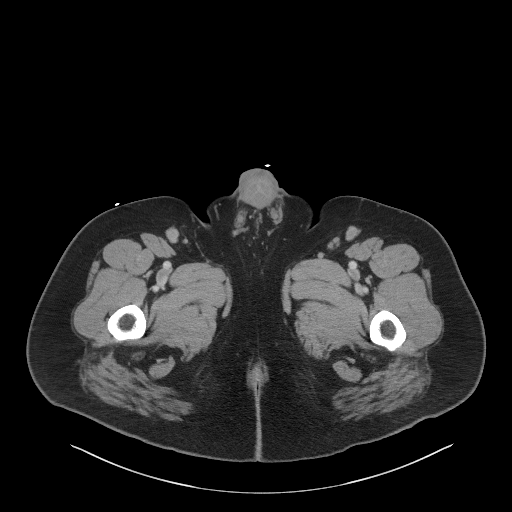
[im 6/116  bone]
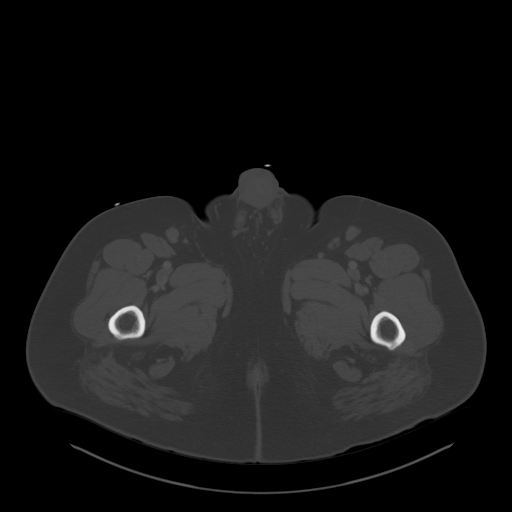
[im 18/116  soft-tissue]
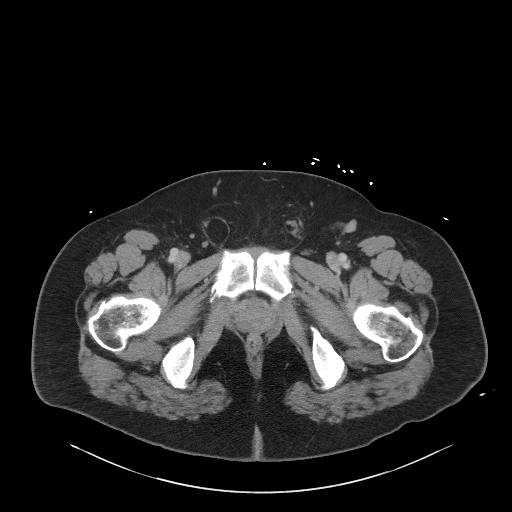
[im 24/116  soft-tissue]
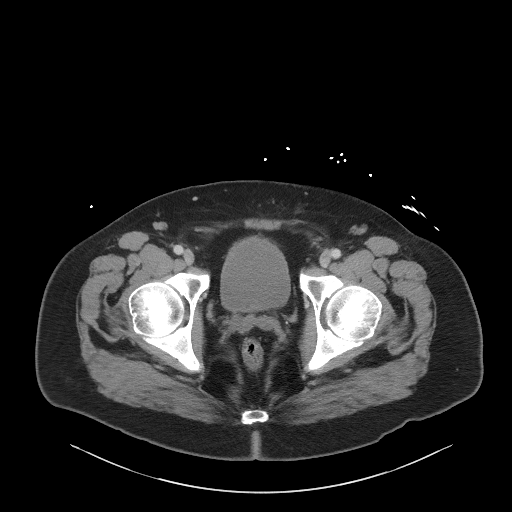
[im 35/116  soft-tissue]
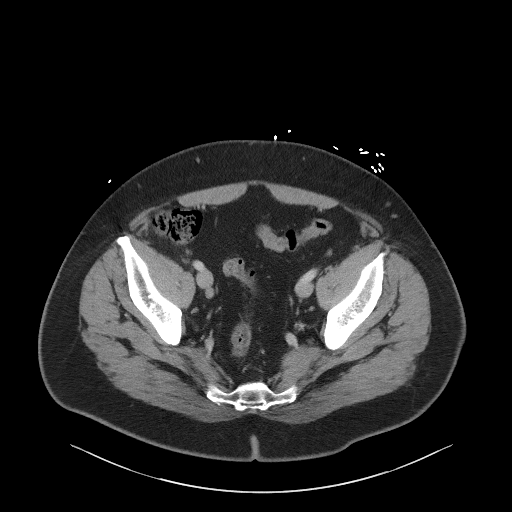
[im 41/116  soft-tissue]
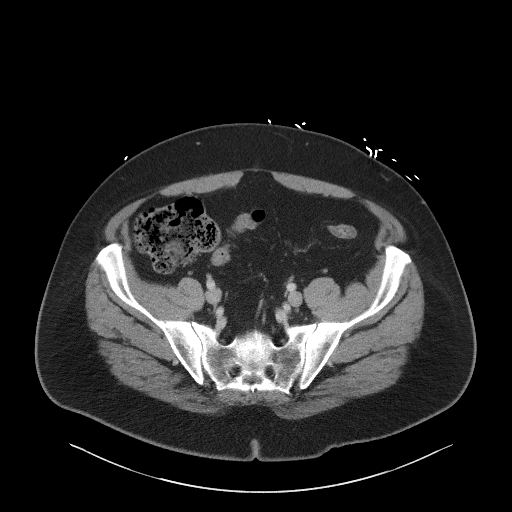
[im 52/116  soft-tissue]
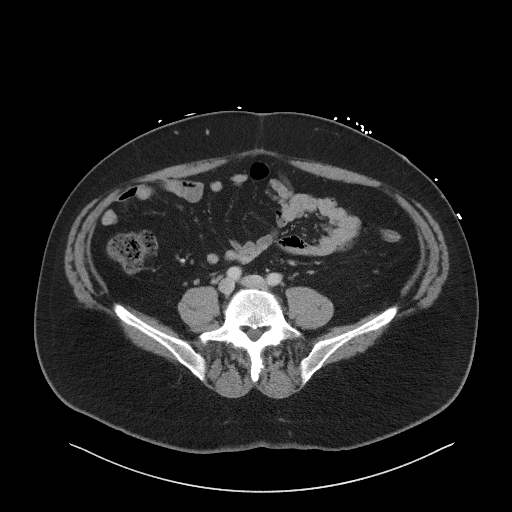
[im 58/116  soft-tissue]
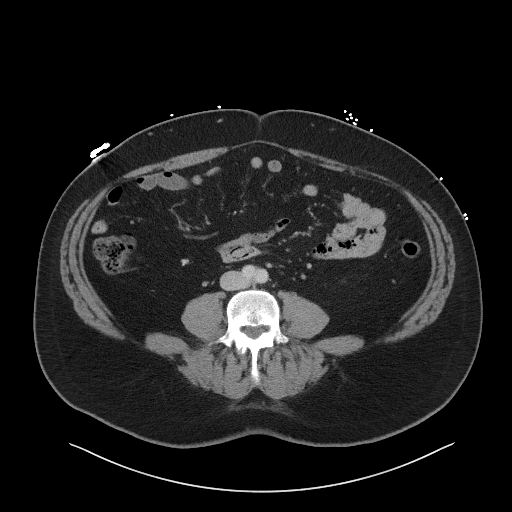
[im 64/116  soft-tissue]
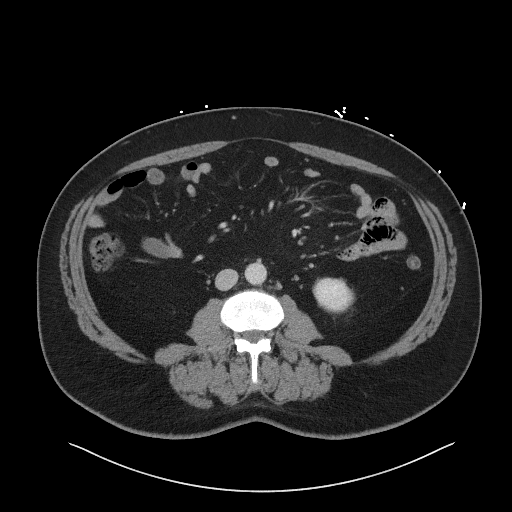
[im 75/116  soft-tissue]
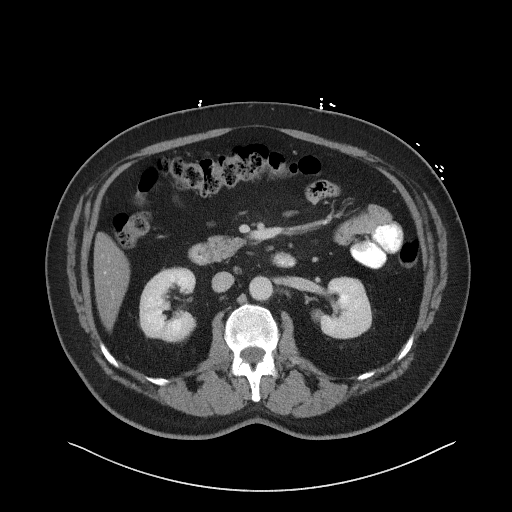
[im 75/116  bone]
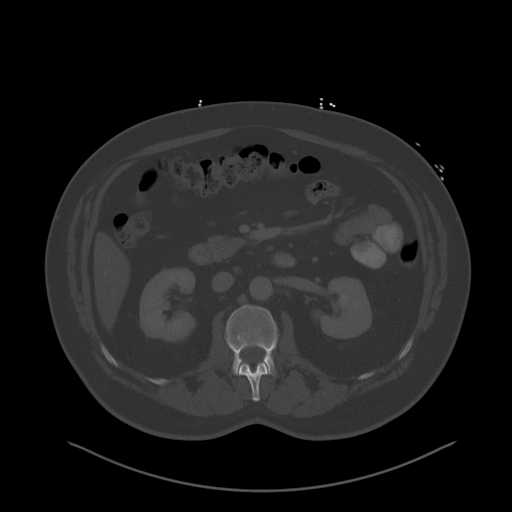
[im 81/116  soft-tissue]
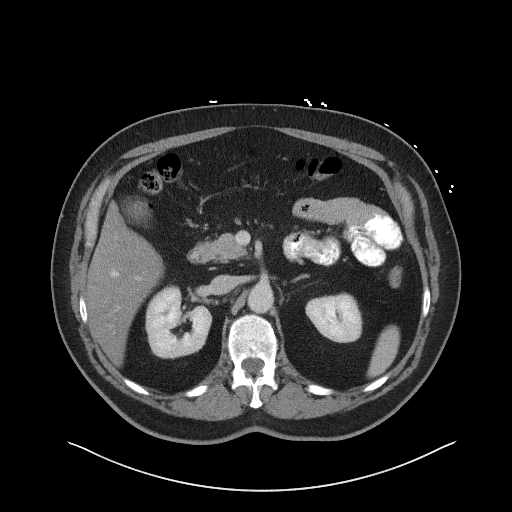
[im 93/116  soft-tissue]
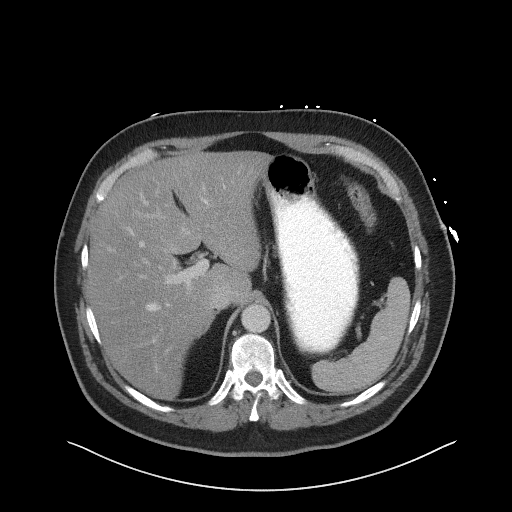
[im 98/116  soft-tissue]
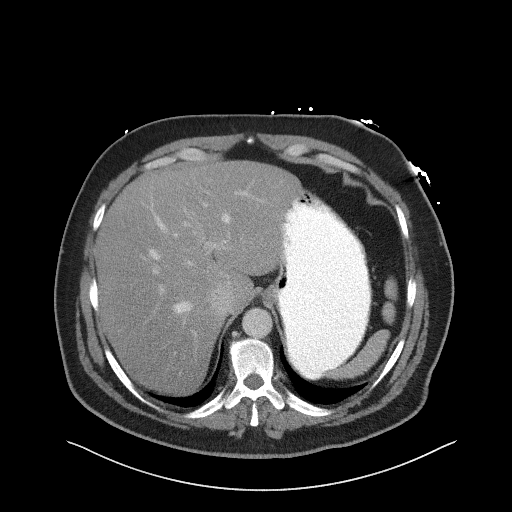
[im 110/116  soft-tissue]
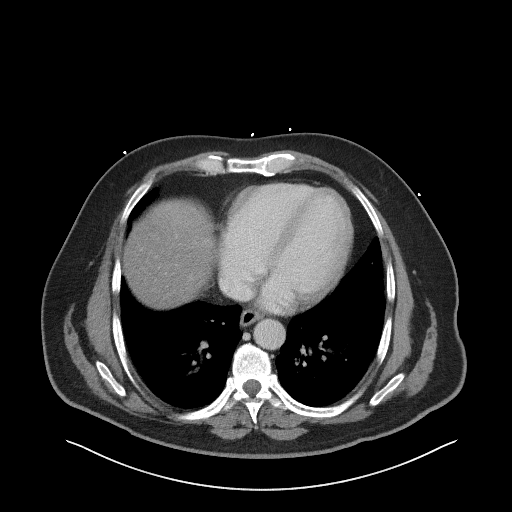

[Series 5: coronal st · coronal · 0.84mm/px · 3 of 103 slices shown]
[im 35/103  soft-tissue]
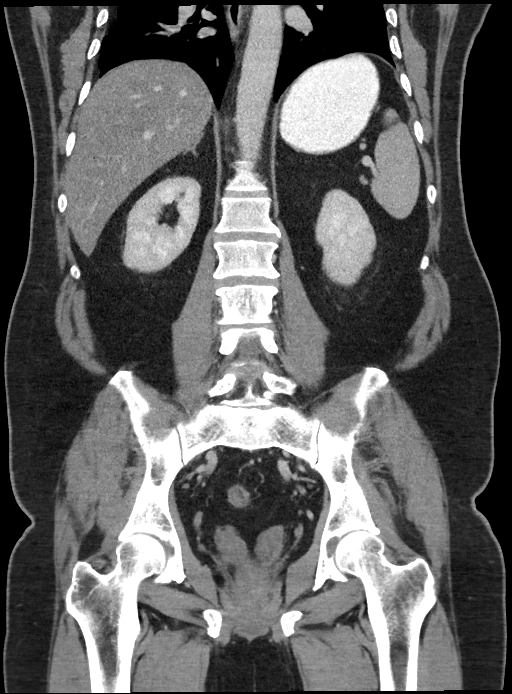
[im 46/103  soft-tissue]
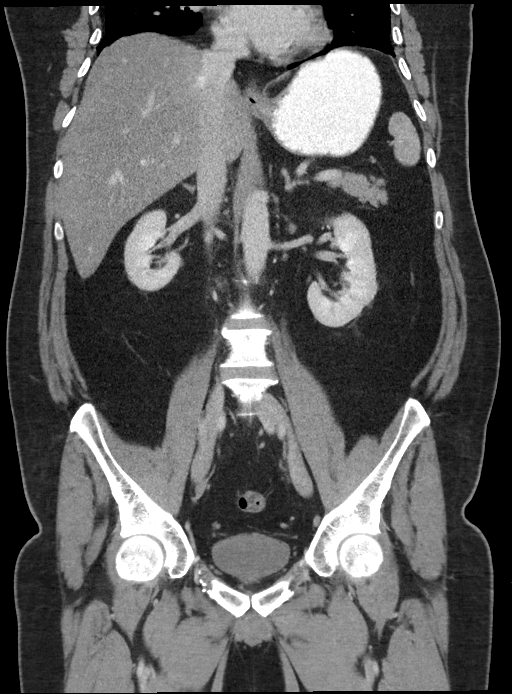
[im 57/103  soft-tissue]
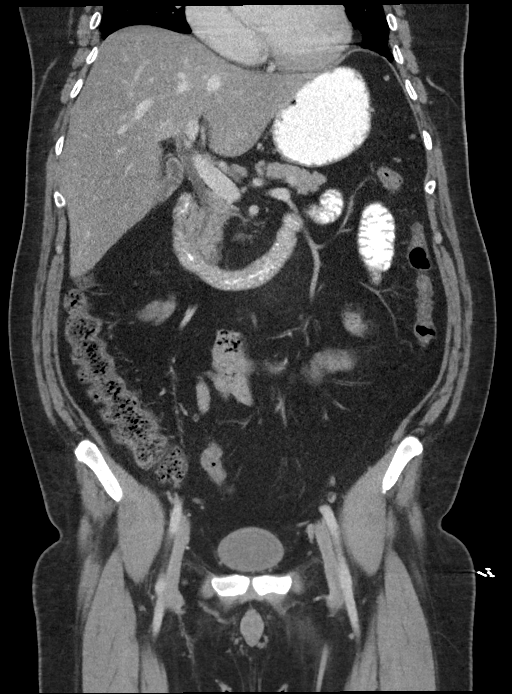

[16 of 46 positions shown; findings below may reference images not displayed]

FINDINGS: Lower chest: 5 mm pulmonary nodule in the right middle lobe that is
stable from prior.

Hepatobiliary: Hepatic steatosis with sparing at the gallbladder
fossa. No focal liver abnormalitycholelithiasis without signs of
acute cholecystitis

Pancreas: Unremarkable.

Spleen: Unremarkable.

Adrenals/Urinary Tract: Negative adrenals. No hydronephrosis or
stone. Small right renal cystic densities. Unremarkable bladder.

Stomach/Bowel: No obstruction. No appendicitis. Rare colonic
diverticula.

Vascular/Lymphatic: No acute vascular abnormality. No mass or
adenopathy.

Reproductive:No pathologic findings. Fatty right inguinal hernia.
Minimal haziness of small bowel mesenteric fat that was also seen
previously. No associated mass or adenopathy.

Other: No ascites or pneumoperitoneum.

Musculoskeletal: No acute abnormalities.
IMPRESSION: 1. No explanation for pain in the left upper quadrant.
2. Hepatic steatosis and cholelithiasis.
